# Patient Record
Sex: Male | Born: 1965 | Race: Black or African American | Hispanic: No | Marital: Single | Smoking: Never smoker
Health system: Southern US, Community
[De-identification: ages and names within clinical notes are randomized; demographics above are authoritative.]

## PROBLEM LIST (undated history)

## (undated) DIAGNOSIS — I10 Essential (primary) hypertension: Secondary | ICD-10-CM

## (undated) DIAGNOSIS — E7800 Pure hypercholesterolemia, unspecified: Secondary | ICD-10-CM

## (undated) DIAGNOSIS — D649 Anemia, unspecified: Secondary | ICD-10-CM

## (undated) DIAGNOSIS — J189 Pneumonia, unspecified organism: Secondary | ICD-10-CM

## (undated) DIAGNOSIS — G4730 Sleep apnea, unspecified: Secondary | ICD-10-CM

## (undated) DIAGNOSIS — M1990 Unspecified osteoarthritis, unspecified site: Secondary | ICD-10-CM

---

## 2001-08-07 ENCOUNTER — Ambulatory Visit (HOSPITAL_COMMUNITY): Admission: RE | Admit: 2001-08-07 | Discharge: 2001-08-07 | Payer: Self-pay | Admitting: Internal Medicine

## 2001-08-07 ENCOUNTER — Encounter: Payer: Self-pay | Admitting: Internal Medicine

## 2003-09-15 HISTORY — PX: OTHER SURGICAL HISTORY: SHX169

## 2017-09-14 HISTORY — PX: HIP ARTHROPLASTY: SHX981

## 2019-09-15 HISTORY — PX: HERNIA REPAIR: SHX51

## 2020-08-23 ENCOUNTER — Other Ambulatory Visit: Payer: Self-pay | Admitting: Neurological Surgery

## 2020-09-19 NOTE — Progress Notes (Signed)
[id number] [place], [place] - [address] [place] [address] Phone: [phone] Fax: [phone]      Your procedure is scheduled on Tuesday, January 11th.  Report to [facility] Main Entrance "A" at 5:30 A.M., and check in at the Admitting office.  Call this number if you have problems the morning of surgery:  [phone]  Call [phone] if you have any questions prior to your surgery date Monday-Friday 8am-4pm    Remember:  Do not eat or drink after midnight the night before your surgery    Take these medicines the morning of surgery with A SIP OF WATER   Amlodipine (Norvasc)  Eye drops - if needed  Omeprazole (Prilosec)  Nasal spray - if needed  Simvastatin (Zocor)    As of today, STOP taking any Aspirin (unless otherwise instructed by your surgeon) Aleve, Naproxen, Ibuprofen, Motrin, Advil, Goody's, BC's, all herbal medications, fish oil, and all vitamins.                      Do not wear jewelry            Do not wear lotions, powders, colognes, or deodorant.            Men may shave face and neck.            Do not bring valuables to the hospital.            [facility] is not responsible for any belongings or valuables.  Do NOT Smoke (Tobacco/Vaping) or drink Alcohol 24 hours prior to your procedure If you use a CPAP at night, you may bring all equipment for your overnight stay.   Contacts, glasses, dentures or bridgework may not be worn into surgery.      For patients admitted to the hospital, discharge time will be determined by your treatment team.   Patients discharged the day of surgery will not be allowed to drive home, and someone needs to stay with them for 24 hours.    Special instructions:   [facility]- Preparing For Surgery  Before surgery, you can play an important role. Because skin is not sterile, your skin needs to be as free of germs as possible. You can reduce the number of germs on your skin by  washing with CHG (chlorahexidine gluconate) Soap before surgery.  CHG is an antiseptic cleaner which kills germs and bonds with the skin to continue killing germs even after washing.    Oral Hygiene is also important to reduce your risk of infection.  Remember - BRUSH YOUR TEETH THE MORNING OF SURGERY WITH YOUR REGULAR TOOTHPASTE  Please do not use if you have an allergy to CHG or antibacterial soaps. If your skin becomes reddened/irritated stop using the CHG.  Do not shave (including legs and underarms) for at least 48 hours prior to first CHG shower. It is OK to shave your face.  Please follow these instructions carefully.   1. Shower the NIGHT BEFORE SURGERY and the MORNING OF SURGERY with CHG Soap.   2. If you chose to wash your hair, wash your hair first as usual with your normal shampoo.  3. After you shampoo, rinse your hair and body thoroughly to remove the shampoo.  4. Use CHG as you would any other liquid soap. You can apply CHG directly to the skin and wash gently with a scrungie or a clean washcloth.   5. Apply the  CHG Soap to your body ONLY FROM THE NECK DOWN.  Do not use on open wounds or open sores. Avoid contact with your eyes, ears, mouth and genitals (private parts). Wash Face and genitals (private parts)  with your normal soap.   6. Wash thoroughly, paying special attention to the area where your surgery will be performed.  7. Thoroughly rinse your body with warm water from the neck down.  8. DO NOT shower/wash with your normal soap after using and rinsing off the CHG Soap.  9. Pat yourself dry with a CLEAN TOWEL.  10. Wear CLEAN PAJAMAS to bed the night before surgery  11. Place CLEAN SHEETS on your bed the night of your first shower and DO NOT SLEEP WITH PETS.   Day of Surgery: Wear Clean/Comfortable clothing the morning of surgery Do not apply any deodorants/lotions.   Remember to brush your teeth WITH YOUR REGULAR TOOTHPASTE.   Please read over the  following fact sheets that you were given.

## 2020-09-20 ENCOUNTER — Other Ambulatory Visit (HOSPITAL_COMMUNITY)
Admission: RE | Admit: 2020-09-20 | Discharge: 2020-09-20 | Disposition: A | Discharge status: Home / Self Care | Payer: BC Managed Care – PPO | Source: Ambulatory Visit | Attending: Neurological Surgery | Admitting: Neurological Surgery

## 2020-09-20 ENCOUNTER — Encounter (HOSPITAL_COMMUNITY)
Admission: RE | Admit: 2020-09-20 | Discharge: 2020-09-20 | Disposition: A | Discharge status: Home / Self Care | Payer: BC Managed Care – PPO | Source: Ambulatory Visit | Attending: Neurological Surgery | Admitting: Neurological Surgery

## 2020-09-20 ENCOUNTER — Encounter (HOSPITAL_COMMUNITY): Payer: Self-pay

## 2020-09-20 ENCOUNTER — Other Ambulatory Visit: Payer: Self-pay

## 2020-09-20 DIAGNOSIS — Z20822 Contact with and (suspected) exposure to covid-19: Secondary | ICD-10-CM | POA: Diagnosis not present

## 2020-09-20 DIAGNOSIS — I10 Essential (primary) hypertension: Secondary | ICD-10-CM | POA: Insufficient documentation

## 2020-09-20 DIAGNOSIS — Z01812 Encounter for preprocedural laboratory examination: Secondary | ICD-10-CM | POA: Diagnosis present

## 2020-09-20 DIAGNOSIS — Z01818 Encounter for other preprocedural examination: Secondary | ICD-10-CM | POA: Diagnosis not present

## 2020-09-20 HISTORY — DX: Essential (primary) hypertension: I10

## 2020-09-20 HISTORY — DX: Pure hypercholesterolemia, unspecified: E78.00

## 2020-09-20 HISTORY — DX: Sleep apnea, unspecified: G47.30

## 2020-09-20 HISTORY — DX: Anemia, unspecified: D64.9

## 2020-09-20 HISTORY — DX: Pneumonia, unspecified organism: J18.9

## 2020-09-20 HISTORY — DX: Unspecified osteoarthritis, unspecified site: M19.90

## 2020-09-20 LAB — BASIC METABOLIC PANEL
Anion gap: 9 (ref 5–15)
BUN: 14 mg/dL (ref 6–20)
CO2: 25 mmol/L (ref 22–32)
Calcium: 9.3 mg/dL (ref 8.9–10.3)
Chloride: 105 mmol/L (ref 98–111)
Creatinine, Ser: 1.55 mg/dL — ABNORMAL HIGH (ref 0.61–1.24)
GFR, Estimated: 53 mL/min — ABNORMAL LOW (ref >60)
Glucose, Bld: 109 mg/dL — ABNORMAL HIGH (ref 70–99)
Potassium: 3.9 mmol/L (ref 3.5–5.1)
Sodium: 139 mmol/L (ref 135–145)

## 2020-09-20 LAB — TYPE AND SCREEN
ABO/RH(D): O POS
Antibody Screen: NEGATIVE

## 2020-09-20 LAB — CBC
HCT: 41.5 % (ref 39.0–52.0)
Hemoglobin: 13.7 g/dL (ref 13.0–17.0)
MCH: 31.3 pg (ref 26.0–34.0)
MCHC: 33 g/dL (ref 30.0–36.0)
MCV: 94.7 fL (ref 80.0–100.0)
Platelets: 240 10*3/uL (ref 150–400)
RBC: 4.38 MIL/uL (ref 4.22–5.81)
RDW: 11.5 % (ref 11.5–15.5)
WBC: 5.5 10*3/uL (ref 4.0–10.5)
nRBC: 0 % (ref 0.0–0.2)

## 2020-09-20 LAB — SURGICAL PCR SCREEN
MRSA, PCR: NEGATIVE
Staphylococcus aureus: POSITIVE — AB

## 2020-09-20 LAB — SARS CORONAVIRUS 2 (TAT 6-24 HRS): SARS Coronavirus 2: NEGATIVE

## 2020-09-20 NOTE — Progress Notes (Addendum)
PCP - Vivia Ewing [place], [place]) Cardiologist - denies  Chest x-ray - n/a EKG - 09-20-20   SA - yes, does not wear CPAP   COVID TEST- 09-20-20   Anesthesia review: yes, EKG  Patient denies shortness of breath, fever, cough and chest pain at PAT appointment   All instructions explained to the patient, with a verbal understanding of the material. Patient agrees to go over the instructions while at home for a better understanding. Patient also instructed to self quarantine after being tested for COVID-19. The opportunity to ask questions was provided.

## 2020-09-24 ENCOUNTER — Ambulatory Visit (HOSPITAL_COMMUNITY): Payer: BC Managed Care – PPO | Admitting: Vascular Surgery

## 2020-09-24 ENCOUNTER — Encounter (HOSPITAL_COMMUNITY): Payer: Self-pay | Admitting: Neurological Surgery

## 2020-09-24 ENCOUNTER — Inpatient Hospital Stay (HOSPITAL_COMMUNITY)
Admission: AD | Admit: 2020-09-24 | Discharge: 2020-09-26 | DRG: 455 | Disposition: A | Discharge status: Home Health | Payer: BC Managed Care – PPO | Attending: Neurological Surgery | Admitting: Neurological Surgery

## 2020-09-24 ENCOUNTER — Encounter (HOSPITAL_COMMUNITY)
Admission: AD | Disposition: A | Discharge status: Home Health | Payer: Self-pay | Source: Home / Self Care | Attending: Neurological Surgery

## 2020-09-24 ENCOUNTER — Other Ambulatory Visit: Payer: Self-pay

## 2020-09-24 ENCOUNTER — Ambulatory Visit (HOSPITAL_COMMUNITY): Payer: BC Managed Care – PPO | Admitting: Certified Registered"

## 2020-09-24 ENCOUNTER — Ambulatory Visit (HOSPITAL_COMMUNITY): Payer: BC Managed Care – PPO

## 2020-09-24 DIAGNOSIS — M4807 Spinal stenosis, lumbosacral region: Secondary | ICD-10-CM | POA: Diagnosis present

## 2020-09-24 DIAGNOSIS — I10 Essential (primary) hypertension: Secondary | ICD-10-CM | POA: Diagnosis present

## 2020-09-24 DIAGNOSIS — Z8701 Personal history of pneumonia (recurrent): Secondary | ICD-10-CM | POA: Diagnosis not present

## 2020-09-24 DIAGNOSIS — M48061 Spinal stenosis, lumbar region without neurogenic claudication: Secondary | ICD-10-CM | POA: Diagnosis present

## 2020-09-24 DIAGNOSIS — M4317 Spondylolisthesis, lumbosacral region: Secondary | ICD-10-CM | POA: Diagnosis present

## 2020-09-24 DIAGNOSIS — M5416 Radiculopathy, lumbar region: Secondary | ICD-10-CM | POA: Diagnosis present

## 2020-09-24 DIAGNOSIS — M1990 Unspecified osteoarthritis, unspecified site: Secondary | ICD-10-CM | POA: Diagnosis present

## 2020-09-24 DIAGNOSIS — G4730 Sleep apnea, unspecified: Secondary | ICD-10-CM | POA: Diagnosis present

## 2020-09-24 DIAGNOSIS — Z419 Encounter for procedure for purposes other than remedying health state, unspecified: Secondary | ICD-10-CM

## 2020-09-24 HISTORY — PX: TRANSFORAMINAL LUMBAR INTERBODY FUSION (TLIF) WITH PEDICLE SCREW FIXATION 3 LEVEL: SHX6143

## 2020-09-24 LAB — ABO/RH: ABO/RH(D): O POS

## 2020-09-24 SURGERY — TRANSFORAMINAL LUMBAR INTERBODY FUSION (TLIF) WITH PEDICLE SCREW FIXATION 3 LEVEL
Anesthesia: General | Site: Back

## 2020-09-24 MED ORDER — AMLODIPINE BESYLATE 5 MG PO TABS
5.0000 mg | ORAL_TABLET | Freq: Every day | ORAL | Status: DC
  Administered 2020-09-24 – 2020-09-26 (×3): 5 mg via ORAL
  Filled 2020-09-24 (×3): qty 1

## 2020-09-24 MED ORDER — CHLORHEXIDINE GLUCONATE CLOTH 2 % EX PADS: 6.0000 | MEDICATED_PAD | Freq: Once | CUTANEOUS | Status: DC

## 2020-09-24 MED ORDER — ACETAMINOPHEN 325 MG PO TABS
650.0000 mg | ORAL_TABLET | ORAL | Status: DC | PRN
  Administered 2020-09-25: 650 mg via ORAL
  Filled 2020-09-24: qty 2

## 2020-09-24 MED ORDER — DOCUSATE SODIUM 100 MG PO CAPS
100.0000 mg | ORAL_CAPSULE | Freq: Two times a day (BID) | ORAL | Status: DC
  Administered 2020-09-24 – 2020-09-26 (×4): 100 mg via ORAL
  Filled 2020-09-24 (×4): qty 1

## 2020-09-24 MED ORDER — CHLORHEXIDINE GLUCONATE 0.12 % MT SOLN: 15.0000 mL | Freq: Once | OROMUCOSAL | Status: AC

## 2020-09-24 MED ORDER — TAMSULOSIN HCL 0.4 MG PO CAPS
0.4000 mg | ORAL_CAPSULE | Freq: Every day | ORAL | Status: DC
  Administered 2020-09-24 – 2020-09-25 (×2): 0.4 mg via ORAL
  Filled 2020-09-24 (×2): qty 1

## 2020-09-24 MED ORDER — PANTOPRAZOLE SODIUM 40 MG PO TBEC
40.0000 mg | DELAYED_RELEASE_TABLET | Freq: Every day | ORAL | Status: DC
  Administered 2020-09-25 – 2020-09-26 (×2): 40 mg via ORAL
  Filled 2020-09-24 (×2): qty 1

## 2020-09-24 MED ORDER — CYCLOBENZAPRINE HCL 10 MG PO TABS
10.0000 mg | ORAL_TABLET | Freq: Three times a day (TID) | ORAL | Status: DC | PRN
  Administered 2020-09-24 – 2020-09-26 (×5): 10 mg via ORAL
  Filled 2020-09-24 (×5): qty 1

## 2020-09-24 MED ORDER — ROCURONIUM BROMIDE 10 MG/ML (PF) SYRINGE
PREFILLED_SYRINGE | INTRAVENOUS | Status: AC
  Filled 2020-09-24: qty 10

## 2020-09-24 MED ORDER — PROPOFOL 10 MG/ML IV BOLUS
INTRAVENOUS | Status: DC | PRN
Start: 2020-09-24 — End: 2020-09-24
  Administered 2020-09-24: 170 mg via INTRAVENOUS

## 2020-09-24 MED ORDER — SODIUM CHLORIDE (PF) 0.9 % IJ SOLN
INTRAMUSCULAR | Status: AC
  Filled 2020-09-24: qty 10

## 2020-09-24 MED ORDER — 0.9 % SODIUM CHLORIDE (POUR BTL) OPTIME
TOPICAL | Status: DC | PRN
  Administered 2020-09-24: 1000 mL

## 2020-09-24 MED ORDER — OXYCODONE HCL 5 MG/5ML PO SOLN: 5.0000 mg | Freq: Once | ORAL | Status: DC | PRN

## 2020-09-24 MED ORDER — ONDANSETRON HCL 4 MG/2ML IJ SOLN
INTRAMUSCULAR | Status: AC
  Filled 2020-09-24: qty 2

## 2020-09-24 MED ORDER — LIDOCAINE-EPINEPHRINE 1 %-1:100000 IJ SOLN
INTRAMUSCULAR | Status: AC
  Filled 2020-09-24: qty 1

## 2020-09-24 MED ORDER — HYDROMORPHONE HCL 1 MG/ML IJ SOLN
1.0000 mg | INTRAMUSCULAR | Status: DC | PRN
  Administered 2020-09-24 – 2020-09-25 (×3): 1 mg via INTRAVENOUS
  Filled 2020-09-24 (×3): qty 1

## 2020-09-24 MED ORDER — SUFENTANIL CITRATE 50 MCG/ML IV SOLN
INTRAVENOUS | Status: DC | PRN
  Administered 2020-09-24 (×2): 10 ug via INTRAVENOUS
  Administered 2020-09-24: 30 ug via INTRAVENOUS
  Administered 2020-09-24 (×3): 10 ug via INTRAVENOUS
  Administered 2020-09-24: 5 ug via INTRAVENOUS
  Administered 2020-09-24: 10 ug via INTRAVENOUS
  Administered 2020-09-24: 5 ug via INTRAVENOUS

## 2020-09-24 MED ORDER — POLYETHYLENE GLYCOL 3350 17 G PO PACK
17.0000 g | PACK | Freq: Every day | ORAL | Status: DC | PRN
  Administered 2020-09-26: 17 g via ORAL
  Filled 2020-09-24: qty 1

## 2020-09-24 MED ORDER — SODIUM CHLORIDE 0.9% FLUSH
3.0000 mL | Freq: Two times a day (BID) | INTRAVENOUS | Status: DC
  Administered 2020-09-24: 3 mL via INTRAVENOUS

## 2020-09-24 MED ORDER — SIMVASTATIN 20 MG PO TABS
40.0000 mg | ORAL_TABLET | Freq: Every day | ORAL | Status: DC
  Administered 2020-09-24 – 2020-09-25 (×2): 40 mg via ORAL
  Filled 2020-09-24 (×2): qty 2

## 2020-09-24 MED ORDER — OXYCODONE HCL 5 MG PO TABS: 5.0000 mg | ORAL_TABLET | ORAL | Status: DC | PRN

## 2020-09-24 MED ORDER — ORAL CARE MOUTH RINSE: 15.0000 mL | Freq: Once | OROMUCOSAL | Status: AC

## 2020-09-24 MED ORDER — CEFAZOLIN SODIUM-DEXTROSE 2-4 GM/100ML-% IV SOLN
2.0000 g | Freq: Three times a day (TID) | INTRAVENOUS | Status: AC
  Administered 2020-09-24 – 2020-09-25 (×2): 2 g via INTRAVENOUS
  Filled 2020-09-24 (×3): qty 100

## 2020-09-24 MED ORDER — PHENYLEPHRINE HCL (PRESSORS) 10 MG/ML IV SOLN
INTRAVENOUS | Status: AC
  Filled 2020-09-24: qty 1

## 2020-09-24 MED ORDER — ONDANSETRON HCL 4 MG/2ML IJ SOLN
INTRAMUSCULAR | Status: DC | PRN
  Administered 2020-09-24: 4 mg via INTRAVENOUS

## 2020-09-24 MED ORDER — CEFAZOLIN SODIUM 1 G IJ SOLR
INTRAMUSCULAR | Status: AC
  Filled 2020-09-24: qty 10

## 2020-09-24 MED ORDER — FENTANYL CITRATE (PF) 100 MCG/2ML IJ SOLN
INTRAMUSCULAR | Status: AC
  Filled 2020-09-24: qty 2

## 2020-09-24 MED ORDER — ACETAMINOPHEN 650 MG RE SUPP: 650.0000 mg | RECTAL | Status: DC | PRN

## 2020-09-24 MED ORDER — OXYCODONE HCL 5 MG PO TABS
10.0000 mg | ORAL_TABLET | ORAL | Status: DC | PRN
  Administered 2020-09-24 – 2020-09-26 (×9): 10 mg via ORAL
  Filled 2020-09-24 (×9): qty 2

## 2020-09-24 MED ORDER — ROCURONIUM BROMIDE 10 MG/ML (PF) SYRINGE
PREFILLED_SYRINGE | INTRAVENOUS | Status: DC | PRN
  Administered 2020-09-24: 60 mg via INTRAVENOUS

## 2020-09-24 MED ORDER — MIDAZOLAM HCL 2 MG/2ML IJ SOLN
INTRAMUSCULAR | Status: AC
  Filled 2020-09-24: qty 2

## 2020-09-24 MED ORDER — MIDAZOLAM HCL 5 MG/5ML IJ SOLN
INTRAMUSCULAR | Status: DC | PRN
  Administered 2020-09-24: 2 mg via INTRAVENOUS

## 2020-09-24 MED ORDER — SODIUM CHLORIDE 0.9% FLUSH: 3.0000 mL | INTRAVENOUS | Status: DC | PRN

## 2020-09-24 MED ORDER — ONDANSETRON HCL 4 MG PO TABS: 4.0000 mg | ORAL_TABLET | Freq: Four times a day (QID) | ORAL | Status: DC | PRN

## 2020-09-24 MED ORDER — EPHEDRINE SULFATE-NACL 50-0.9 MG/10ML-% IV SOSY
PREFILLED_SYRINGE | INTRAVENOUS | Status: DC | PRN
  Administered 2020-09-24: 10 mg via INTRAVENOUS

## 2020-09-24 MED ORDER — THROMBIN 5000 UNITS EX SOLR: OROMUCOSAL | Status: DC | PRN

## 2020-09-24 MED ORDER — HYDROCHLOROTHIAZIDE 12.5 MG PO CAPS
12.5000 mg | ORAL_CAPSULE | Freq: Every day | ORAL | Status: DC
  Administered 2020-09-24 – 2020-09-26 (×3): 12.5 mg via ORAL
  Filled 2020-09-24 (×3): qty 1

## 2020-09-24 MED ORDER — SUFENTANIL CITRATE 50 MCG/ML IV SOLN
INTRAVENOUS | Status: AC
  Filled 2020-09-24: qty 1

## 2020-09-24 MED ORDER — LIDOCAINE 2% (20 MG/ML) 5 ML SYRINGE
INTRAMUSCULAR | Status: DC | PRN
  Administered 2020-09-24: 60 mg via INTRAVENOUS

## 2020-09-24 MED ORDER — BUPIVACAINE HCL (PF) 0.5 % IJ SOLN
INTRAMUSCULAR | Status: DC | PRN
  Administered 2020-09-24: 5 mL

## 2020-09-24 MED ORDER — VALSARTAN-HYDROCHLOROTHIAZIDE 160-12.5 MG PO TABS: 1.0000 | ORAL_TABLET | Freq: Every day | ORAL | Status: DC

## 2020-09-24 MED ORDER — PHENYLEPHRINE HCL-NACL 10-0.9 MG/250ML-% IV SOLN
INTRAVENOUS | Status: DC | PRN
  Administered 2020-09-24: 50 ug/min via INTRAVENOUS

## 2020-09-24 MED ORDER — PROPOFOL 10 MG/ML IV BOLUS
INTRAVENOUS | Status: AC
  Filled 2020-09-24: qty 20

## 2020-09-24 MED ORDER — IRBESARTAN 150 MG PO TABS
150.0000 mg | ORAL_TABLET | Freq: Every day | ORAL | Status: DC
  Administered 2020-09-24 – 2020-09-26 (×3): 150 mg via ORAL
  Filled 2020-09-24 (×3): qty 1

## 2020-09-24 MED ORDER — OXYCODONE HCL 5 MG PO TABS: 5.0000 mg | ORAL_TABLET | Freq: Once | ORAL | Status: DC | PRN

## 2020-09-24 MED ORDER — PHENYLEPHRINE 40 MCG/ML (10ML) SYRINGE FOR IV PUSH (FOR BLOOD PRESSURE SUPPORT)
PREFILLED_SYRINGE | INTRAVENOUS | Status: DC | PRN
  Administered 2020-09-24: 80 ug via INTRAVENOUS
  Administered 2020-09-24 (×2): 120 ug via INTRAVENOUS
  Administered 2020-09-24: 80 ug via INTRAVENOUS

## 2020-09-24 MED ORDER — DEXAMETHASONE SODIUM PHOSPHATE 10 MG/ML IJ SOLN
INTRAMUSCULAR | Status: DC | PRN
  Administered 2020-09-24: 10 mg via INTRAVENOUS

## 2020-09-24 MED ORDER — ALBUMIN HUMAN 5 % IV SOLN: INTRAVENOUS | Status: DC | PRN

## 2020-09-24 MED ORDER — LATANOPROST 0.005 % OP SOLN
1.0000 [drp] | Freq: Every day | OPHTHALMIC | Status: DC
  Administered 2020-09-24 – 2020-09-25 (×2): 1 [drp] via OPHTHALMIC
  Filled 2020-09-24: qty 2.5

## 2020-09-24 MED ORDER — THROMBIN 5000 UNITS EX SOLR
CUTANEOUS | Status: AC
  Filled 2020-09-24: qty 5000

## 2020-09-24 MED ORDER — CHLORHEXIDINE GLUCONATE 0.12 % MT SOLN
OROMUCOSAL | Status: AC
  Administered 2020-09-24: 15 mL via OROMUCOSAL
  Filled 2020-09-24: qty 15

## 2020-09-24 MED ORDER — MENTHOL 3 MG MT LOZG: 1.0000 | LOZENGE | OROMUCOSAL | Status: DC | PRN

## 2020-09-24 MED ORDER — PHENOL 1.4 % MT LIQD: 1.0000 | OROMUCOSAL | Status: DC | PRN

## 2020-09-24 MED ORDER — BUPIVACAINE HCL (PF) 0.5 % IJ SOLN
INTRAMUSCULAR | Status: AC
  Filled 2020-09-24: qty 30

## 2020-09-24 MED ORDER — DEXAMETHASONE SODIUM PHOSPHATE 10 MG/ML IJ SOLN
INTRAMUSCULAR | Status: AC
  Filled 2020-09-24: qty 1

## 2020-09-24 MED ORDER — ONDANSETRON HCL 4 MG/2ML IJ SOLN: 4.0000 mg | Freq: Four times a day (QID) | INTRAMUSCULAR | Status: DC | PRN

## 2020-09-24 MED ORDER — OXYMETAZOLINE HCL 0.05 % NA SOLN
1.0000 | Freq: Every evening | NASAL | Status: DC | PRN
  Filled 2020-09-24: qty 30

## 2020-09-24 MED ORDER — PHENYLEPHRINE 40 MCG/ML (10ML) SYRINGE FOR IV PUSH (FOR BLOOD PRESSURE SUPPORT)
PREFILLED_SYRINGE | INTRAVENOUS | Status: AC
  Filled 2020-09-24: qty 10

## 2020-09-24 MED ORDER — SODIUM CHLORIDE 0.9 % IV SOLN: 250.0000 mL | INTRAVENOUS | Status: DC

## 2020-09-24 MED ORDER — LIDOCAINE-EPINEPHRINE 1 %-1:100000 IJ SOLN
INTRAMUSCULAR | Status: DC | PRN
  Administered 2020-09-24: 5 mL

## 2020-09-24 MED ORDER — EPHEDRINE 5 MG/ML INJ
INTRAVENOUS | Status: AC
  Filled 2020-09-24: qty 10

## 2020-09-24 MED ORDER — FENTANYL CITRATE (PF) 100 MCG/2ML IJ SOLN
25.0000 ug | INTRAMUSCULAR | Status: DC | PRN
  Administered 2020-09-24 (×4): 25 ug via INTRAVENOUS

## 2020-09-24 MED ORDER — TRAZODONE HCL 100 MG PO TABS
100.0000 mg | ORAL_TABLET | Freq: Every evening | ORAL | Status: DC | PRN
  Filled 2020-09-24: qty 1

## 2020-09-24 MED ORDER — LACTATED RINGERS IV SOLN: INTRAVENOUS | Status: DC

## 2020-09-24 MED ORDER — CEFAZOLIN SODIUM-DEXTROSE 2-4 GM/100ML-% IV SOLN
INTRAVENOUS | Status: AC
  Filled 2020-09-24: qty 100

## 2020-09-24 MED ORDER — LIDOCAINE 2% (20 MG/ML) 5 ML SYRINGE
INTRAMUSCULAR | Status: AC
  Filled 2020-09-24: qty 5

## 2020-09-24 MED ORDER — CEFAZOLIN SODIUM-DEXTROSE 2-4 GM/100ML-% IV SOLN
2.0000 g | INTRAVENOUS | Status: AC
  Administered 2020-09-24 (×2): 2 g via INTRAVENOUS

## 2020-09-24 SURGICAL SUPPLY — 81 items
ADH SKN CLS APL DERMABOND .7 (GAUZE/BANDAGES/DRESSINGS) ×2
APL SKNCLS STERI-STRIP NONHPOA (GAUZE/BANDAGES/DRESSINGS)
BAND INSRT 18 STRL LF DISP RB (MISCELLANEOUS)
BAND RUBBER #18 3X1/16 STRL (MISCELLANEOUS) ×2 IMPLANT
BASKET BONE COLLECTION (BASKET) ×1 IMPLANT
BENZOIN TINCTURE PRP APPL 2/3 (GAUZE/BANDAGES/DRESSINGS) IMPLANT
BLADE CLIPPER SURG (BLADE) IMPLANT
BLADE SURG 11 STRL SS (BLADE) ×3 IMPLANT
BUR MATCHSTICK NEURO 3.0 LAGG (BURR) ×3 IMPLANT
BUR PRECISION FLUTE 5.0 (BURR) ×7 IMPLANT
CANISTER SUCT 3000ML PPV (MISCELLANEOUS) ×3 IMPLANT
CNTNR URN SCR LID CUP LEK RST (MISCELLANEOUS) ×2 IMPLANT
CONT SPEC 4OZ STRL OR WHT (MISCELLANEOUS) ×3
COVER BACK TABLE 60X90IN (DRAPES) ×3 IMPLANT
COVER WAND RF STERILE (DRAPES) ×3 IMPLANT
DECANTER SPIKE VIAL GLASS SM (MISCELLANEOUS) ×3 IMPLANT
DERMABOND ADVANCED (GAUZE/BANDAGES/DRESSINGS) ×1
DERMABOND ADVANCED .7 DNX12 (GAUZE/BANDAGES/DRESSINGS) ×2 IMPLANT
DEVICE INTERBODY ELEVATE 23X7 (Cage) ×2 IMPLANT
DEVICE INTERBODY ELEVATE 23X8 (Cage) ×6 IMPLANT
DRAPE C-ARM 42X72 X-RAY (DRAPES) IMPLANT
DRAPE C-ARMOR (DRAPES) ×2 IMPLANT
DRAPE LAPAROTOMY 100X72X124 (DRAPES) ×3 IMPLANT
DRAPE MICROSCOPE LEICA (MISCELLANEOUS) ×1 IMPLANT
DRAPE SURG 17X23 STRL (DRAPES) ×3 IMPLANT
DURAPREP 26ML APPLICATOR (WOUND CARE) ×3 IMPLANT
ELECT REM PT RETURN 9FT ADLT (ELECTROSURGICAL) ×3
ELECTRODE REM PT RTRN 9FT ADLT (ELECTROSURGICAL) ×2 IMPLANT
EVACUATOR 1/8 PVC DRAIN (DRAIN) ×2 IMPLANT
GAUZE 4X4 16PLY RFD (DISPOSABLE) IMPLANT
GAUZE SPONGE 4X4 12PLY STRL (GAUZE/BANDAGES/DRESSINGS) IMPLANT
GLOVE BIO SURGEON STRL SZ8 (GLOVE) ×4 IMPLANT
GLOVE BIOGEL PI IND STRL 7.5 (GLOVE) ×4 IMPLANT
GLOVE BIOGEL PI INDICATOR 7.5 (GLOVE) ×2
GLOVE ECLIPSE 6.5 STRL STRAW (GLOVE) ×6 IMPLANT
GLOVE ECLIPSE 7.5 STRL STRAW (GLOVE) ×10 IMPLANT
GLOVE EXAM NITRILE LRG STRL (GLOVE) IMPLANT
GLOVE EXAM NITRILE XL STR (GLOVE) IMPLANT
GLOVE EXAM NITRILE XS STR PU (GLOVE) IMPLANT
GLOVE SRG 8 PF TXTR STRL LF DI (GLOVE) ×2 IMPLANT
GLOVE SURG UNDER POLY LF SZ6.5 (GLOVE) ×4 IMPLANT
GLOVE SURG UNDER POLY LF SZ8 (GLOVE) ×6
GOWN STRL REUS W/ TWL LRG LVL3 (GOWN DISPOSABLE) ×8 IMPLANT
GOWN STRL REUS W/ TWL XL LVL3 (GOWN DISPOSABLE) ×1 IMPLANT
GOWN STRL REUS W/TWL 2XL LVL3 (GOWN DISPOSABLE) ×2 IMPLANT
GOWN STRL REUS W/TWL LRG LVL3 (GOWN DISPOSABLE) ×12
GOWN STRL REUS W/TWL XL LVL3 (GOWN DISPOSABLE) ×3
HEMOSTAT POWDER KIT SURGIFOAM (HEMOSTASIS) ×3 IMPLANT
KIT BASIN OR (CUSTOM PROCEDURE TRAY) ×3 IMPLANT
KIT POSITION SURG JACKSON T1 (MISCELLANEOUS) ×3 IMPLANT
KIT TURNOVER KIT B (KITS) ×3 IMPLANT
MILL MEDIUM DISP (BLADE) IMPLANT
NDL HYPO 18GX1.5 BLUNT FILL (NEEDLE) IMPLANT
NDL SPNL 18GX3.5 QUINCKE PK (NEEDLE) IMPLANT
NEEDLE HYPO 18GX1.5 BLUNT FILL (NEEDLE) IMPLANT
NEEDLE HYPO 22GX1.5 SAFETY (NEEDLE) ×3 IMPLANT
NEEDLE SPNL 18GX3.5 QUINCKE PK (NEEDLE) ×3 IMPLANT
NS IRRIG 1000ML POUR BTL (IV SOLUTION) ×3 IMPLANT
PACK LAMINECTOMY NEURO (CUSTOM PROCEDURE TRAY) ×3 IMPLANT
PAD ARMBOARD 7.5X6 YLW CONV (MISCELLANEOUS) ×13 IMPLANT
ROD CVD SOLERA 5.5X90 (Rod) ×4 IMPLANT
SCREW 6.5X40 (Screw) ×16 IMPLANT
SCREW SET SOLERA (Screw) ×24 IMPLANT
SCREW SET SOLERA TI5.5 (Screw) ×8 IMPLANT
SPACER SPNL STD 23X8XSTRL (Cage) ×1 IMPLANT
SPACER SPNL XLORDOTIC 23X8X (Cage) ×1 IMPLANT
SPCR SPNL STD 23X8XSTRL (Cage) ×2 IMPLANT
SPCR SPNL XLORDOTIC 23X8X (Cage) ×2 IMPLANT
SPONGE LAP 4X18 RFD (DISPOSABLE) IMPLANT
SPONGE SURGIFOAM ABS GEL 100 (HEMOSTASIS) IMPLANT
STRIP CLOSURE SKIN 1/2X4 (GAUZE/BANDAGES/DRESSINGS) IMPLANT
SUT MNCRL AB 3-0 PS2 18 (SUTURE) ×3 IMPLANT
SUT VIC AB 0 CT1 18XCR BRD8 (SUTURE) ×2 IMPLANT
SUT VIC AB 0 CT1 8-18 (SUTURE) ×3
SUT VIC AB 2-0 CP2 18 (SUTURE) ×3 IMPLANT
SUT VIC AB 3-0 SH 8-18 (SUTURE) ×2 IMPLANT
SYR 3ML LL SCALE MARK (SYRINGE) IMPLANT
TOWEL GREEN STERILE (TOWEL DISPOSABLE) ×3 IMPLANT
TOWEL GREEN STERILE FF (TOWEL DISPOSABLE) ×3 IMPLANT
TRAY FOLEY MTR SLVR 16FR STAT (SET/KITS/TRAYS/PACK) ×3 IMPLANT
WATER STERILE IRR 1000ML POUR (IV SOLUTION) ×3 IMPLANT

## 2020-09-24 NOTE — Brief Op Note (Signed)
09/24/2020  3:35 PM  PATIENT:  Dale Grimes  55 y.o. male  PRE-OPERATIVE DIAGNOSIS:  Lumbar radiculopathy  POST-OPERATIVE DIAGNOSIS:  Lumbar radiculopathy  PROCEDURE:  Procedure(s): Lumbar three -four, lumbar  four-five Lumbar five Sacral one Transforaminal lumbar interbody fusion (N/A)  SURGEON:  Surgeon(s) and Role:    * Jadene Pierini, MD - Primary    * Dawley, Alan Mulder, DO - Assisting  PHYSICIAN ASSISTANT:   ANESTHESIA:   general  EBL:  400 mL   BLOOD ADMINISTERED:none  DRAINS: Hemovac in lumbar subfascial space   LOCAL MEDICATIONS USED:  LIDOCAINE   SPECIMEN:  No Specimen  DISPOSITION OF SPECIMEN:  N/A  COUNTS:  YES  TOURNIQUET:  * No tourniquets in log *  DICTATION: .Note written in EPIC  PLAN OF CARE: Admit to inpatient   PATIENT DISPOSITION:  PACU - hemodynamically stable.   Delay start of Pharmacological VTE agent (>24hrs) due to surgical blood loss or risk of bleeding: yes

## 2020-09-24 NOTE — H&P (Signed)
Surgical H&P Update  HPI: 55 y.o. man with a history of right greater than left lower extremity radiculopathy with weakness. Initial symptoms consisted of severe pain in R>L L4, L5, and S1 dermatomes of the right greater than left foot. 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 No changes in health since he was last seen. Still having symptoms and wishes to proceed with surgery.  PMHx:  Past Medical History:  Diagnosis Date  . Anemia   . Arthritis   . High cholesterol   . Hypertension   . Pneumonia   . Sleep apnea    Does not wear CPAP   FamHx: History reviewed. No pertinent family history. SocHx:  reports that he has never smoked. He has never used smokeless tobacco. He reports current alcohol use of about 2.0 standard drinks of alcohol per week. He reports that he does not use drugs.  Physical Exam: AOx3, PERRL, FS, TM  Strength 5/5 x4 except 4/5 in right DF/TA/EHL, SILTx4 except L5 and S1 dermatomes on right, L5 on left  Assesment/Plan: 55 y.o. 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 Risks, benefits, and alternatives discussed and the patient would like to continue with surgery.  -OR today -3C post-op  Jadene Pierini, MD 09/24/20 7:10 AM

## 2020-09-24 NOTE — Anesthesia Procedure Notes (Addendum)
Procedure Name: Intubation Date/Time: 09/24/2020 7:37 AM Performed by: Moshe Salisbury, CRNA Pre-anesthesia Checklist: Patient identified, Emergency Drugs available, Suction available and Patient being monitored Patient Re-evaluated:Patient Re-evaluated prior to induction Oxygen Delivery Method: Circle System Utilized Preoxygenation: Pre-oxygenation with 100% oxygen Induction Type: IV induction Ventilation: Mask ventilation without difficulty Laryngoscope Size: Mac and 4 Grade View: Grade I Tube type: Oral Tube size: 8.0 mm Number of attempts: 1 Airway Equipment and Method: Stylet Placement Confirmation: ETT inserted through vocal cords under direct vision,  positive ETCO2 and breath sounds checked- equal and bilateral Secured at: 24 cm Tube secured with: Tape Dental Injury: Teeth and Oropharynx as per pre-operative assessment

## 2020-09-24 NOTE — Anesthesia Preprocedure Evaluation (Signed)
Anesthesia Evaluation  Patient identified by MRN, date of birth, ID band Patient awake    Reviewed: Allergy & Precautions, H&P , NPO status , Patient's Chart, lab work & pertinent test results  Airway Mallampati: II   Neck ROM: full    Dental   Pulmonary sleep apnea ,    breath sounds clear to auscultation       Cardiovascular hypertension,  Rhythm:regular Rate:Normal     Neuro/Psych    GI/Hepatic   Endo/Other    Renal/GU      Musculoskeletal  (+) Arthritis ,   Abdominal   Peds  Hematology   Anesthesia Other Findings   Reproductive/Obstetrics                             Anesthesia Physical Anesthesia Plan  ASA: II  Anesthesia Plan: General   Post-op Pain Management:    Induction: Intravenous  PONV Risk Score and Plan: 2 and Ondansetron, Dexamethasone, Midazolam and Treatment may vary due to age or medical condition  Airway Management Planned: Oral ETT  Additional Equipment:   Intra-op Plan:   Post-operative Plan: Extubation in OR  Informed Consent: I have reviewed the patients History and Physical, chart, labs and discussed the procedure including the risks, benefits and alternatives for the proposed anesthesia with the patient or authorized representative who has indicated his/her understanding and acceptance.       Plan Discussed with: CRNA, Anesthesiologist and Surgeon  Anesthesia Plan Comments:         Anesthesia Quick Evaluation

## 2020-09-24 NOTE — Progress Notes (Signed)
Orthopedic Tech Progress Note Patient Details:  Dale Grimes 02-21-1966 [id number]  Ortho Devices Type of Ortho Device: Lumbar corsett Ortho Device/Splint Interventions: Ordered,Application,Adjustment   Post Interventions Patient Tolerated: Well Instructions Provided: Care of device,Adjustment of device   Zebedee Iba 09/24/2020, 5:10 PM

## 2020-09-24 NOTE — Op Note (Signed)
PATIENT: Dale Grimes  DAY OF SURGERY: 09/24/20   PRE-OPERATIVE DIAGNOSIS:  Lumbar radiculopathy   POST-OPERATIVE DIAGNOSIS:  Same   PROCEDURE:  Bilateral L3-4, L4-5 and L5-S1 foraminotomies with complete facetectomies, L3-4/L4-5/L5-S1 TLIF with pedicle screw instrumentation and posterolateral fusion   SURGEON:  Surgeon(s) and Role:    Jadene Pierini, MD - Primary    Dawley, Kendell Bane, MD - Assisting   ANESTHESIA: ETGA   BRIEF HISTORY: This is a 55 year old man who presented with BLE radiculopathy with foot weakness. The patient was found to have severe foraminal disease at L4-5 and L5-S1. This was discussed with the patient as well as risks, benefits, and alternatives and wished to proceed with surgery.   OPERATIVE DETAIL: The patient was taken to the operating room and anesthesia was induced by the anesthesia team. They were placed on the OR table in the prone position with padding of all pressure points. A formal time out was performed with two patient identifiers and confirmed the operative site. The operative site was marked, hair was clipped with surgical clippers, the area was then prepped and draped in a sterile fashion. Fluoro was used to localize the operative level and a midline incision was placed to expose from L4 to S1. Subperiosteal dissection was performed bilaterally and fluoroscopy was again used to confirm the surgical level.   Decompression was started with bilateral facetectomies at the cranial aspect of the exposure. I used fluoroscopic guidance to confirm the location of the pedicles for the facetectomy. After getting the first fluoroscopic shot, I repeated a level count from the sacrum and realized I had performed a facetectomy at L3-4. Given that I had completely disrupted the joint on one side, I unfortunately had to include that level in the construct and completed the facetectomies bilaterally at L3-4. I then used fluoroscopy to confirm landmarks and level count and  performed bilateral facetectomies and foraminotomies at L4-5 and L5-S1. At each level, this was performed by using a using a combination of high speed drill and rongeurs, followed by removal of the ligamentum flavum, palpation and identification of the pedicles above and below, then following the exiting nerve root to confirm good decompression.  Instrumentation was then performed, starting with the interbody placement. At each level, the disc space was identified, nerve root and thecal sac were protected, an annulotomy was performed, then a maximal discectomy was performed with thorough endplate preparation. Using fluoroscopy for guidance, the disc space was packed with bone meal, expandable PEEK interbody devices (Medtronic) were placed into the disc space and expanded. At each level, AP and lateral fluoroscopy was used to confirm placement before release. Xrays showed good restoration of height and lordosis. This was performed on the left at L3-4 and L4-5 then on the right at L5-S1.  Fluoroscopy was used to guide placement of bilateral pedicle screws (Medtronic) at L3, L4, L5, and S1. These were placed by localizing the pedicle with standard landmarks, drilling a pilot hole, cannulating the pedicle with an awl, palpating for pedicle wall breaches, tapping, palpating, and then placing the screw. These were connected with rods bilaterally, compressed, and final tightened according to manufacturer torque specifications. The bone was thoroughly decorticated over the fusion surfaces along the transverse processes and the previously resected bone fragments were morselized and used as autograft. There was enough autograft that it did not need to be augmented with allograft.  A drain was placed subfascially, tunneled, and secured. All instrument and sponge counts were correct, the  incision was then closed in layers. The patient was then returned to anesthesia for emergence. No apparent complications at the  completion of the procedure. I discussed the above with the patient and his wife regarding the reasons behind the additional level of instrumentation.   EBL:    DRAINS: Subfascial drain   SPECIMENS: none   Jadene Pierini, MD 09/24/20 7:13 AM

## 2020-09-24 NOTE — Transfer of Care (Signed)
Immediate Anesthesia Transfer of Care Note  Patient: Dale Grimes  Procedure(s) Performed: Lumbar three -four, lumbar  four-five Lumbar five Sacral one Transforaminal lumbar interbody fusion (N/A Back)  Patient Location: PACU  Anesthesia Type:General  Level of Consciousness: drowsy and patient cooperative  Airway & Oxygen Therapy: Patient Spontanous Breathing and Patient connected to nasal cannula oxygen  Post-op Assessment: Report given to RN, Post -op Vital signs reviewed and stable and Patient moving all extremities  Post vital signs: Reviewed and stable  Last Vitals:  Vitals Value Taken Time  BP 132/72 09/24/20 1448  Temp    Pulse 115 09/24/20 1449  Resp 13 09/24/20 1449  SpO2 100 % 09/24/20 1449  Vitals shown include unvalidated device data.  Last Pain:  Vitals:   09/24/20 0627  TempSrc:   PainSc: 2          Complications: No complications documented.

## 2020-09-25 ENCOUNTER — Encounter (HOSPITAL_COMMUNITY): Payer: Self-pay | Admitting: Neurological Surgery

## 2020-09-25 NOTE — Progress Notes (Signed)
Neurosurgery Service Progress Note  Subjective: No acute events overnight. Significant back pain, as expected but not out of proportion, legs still feel much improved from preop.   Objective: Vitals:   09/24/20 1925 09/24/20 2313 09/25/20 0437 09/25/20 0749  BP: (!) 146/86 139/85 (!) 158/82 118/69  Pulse: 94 88 90 98  Resp: 20 20 20 19   Temp: 97.7 F (36.5 C) 97.9 F (36.6 C) (!) 97.5 F (36.4 C) 97.9 F (36.6 C)  TempSrc: Oral Oral Oral   SpO2: 98% 98% 95% 93%  Weight:      Height:       Temp (24hrs), Avg:97.6 F (36.4 C), Min:97.1 F (36.2 C), Max:97.9 F (36.6 C)  CBC Latest Ref Rng & Units 09/20/2020  WBC 4.0 - 10.5 K/uL 5.5  Hemoglobin 13.0 - 17.0 g/dL 11/18/2020  Hematocrit 40.9 - 52.0 % 41.5  Platelets 150 - 400 K/uL 240   BMP Latest Ref Rng & Units 09/20/2020  Glucose 70 - 99 mg/dL 11/18/2020)  BUN 6 - 20 mg/dL 14  Creatinine 914(N - 8.29 mg/dL 5.62)  Sodium 1.30(Q - 657 mmol/L 139  Potassium 3.5 - 5.1 mmol/L 3.9  Chloride 98 - 111 mmol/L 105  CO2 22 - 32 mmol/L 25  Calcium 8.9 - 10.3 mg/dL 9.3    Intake/Output Summary (Last 24 hours) at 09/25/2020 0825 Last data filed at 09/25/2020 0507 Gross per 24 hour  Intake 2600 ml  Output 1405 ml  Net 1195 ml    Current Facility-Administered Medications:  .  0.9 %  sodium chloride infusion, 250 mL, Intravenous, Continuous, Benz Vandenberghe A, MD .  acetaminophen (TYLENOL) tablet 650 mg, 650 mg, Oral, Q4H PRN **OR** acetaminophen (TYLENOL) suppository 650 mg, 650 mg, Rectal, Q4H PRN, Detrick Dani A, MD .  amLODipine (NORVASC) tablet 5 mg, 5 mg, Oral, Daily, Tahara Ruffini, 11/23/2020, MD, 5 mg at 09/24/20 1737 .  cyclobenzaprine (FLEXERIL) tablet 10 mg, 10 mg, Oral, TID PRN, 11/22/20, MD, 10 mg at 09/25/20 0427 .  docusate sodium (COLACE) capsule 100 mg, 100 mg, Oral, BID, 11/23/20, MD, 100 mg at 09/24/20 2154 .  irbesartan (AVAPRO) tablet 150 mg, 150 mg, Oral, Daily, 150 mg at 09/24/20 1736 **AND**  hydrochlorothiazide (MICROZIDE) capsule 12.5 mg, 12.5 mg, Oral, Daily, Tiye Huwe A, MD, 12.5 mg at 09/24/20 1737 .  HYDROmorphone (DILAUDID) injection 1 mg, 1 mg, Intravenous, Q3H PRN, 11/22/20, MD, 1 mg at 09/24/20 2154 .  latanoprost (XALATAN) 0.005 % ophthalmic solution 1 drop, 1 drop, Both Eyes, QHS, Abby Tucholski A, MD, 1 drop at 09/24/20 2154 .  menthol-cetylpyridinium (CEPACOL) lozenge 3 mg, 1 lozenge, Oral, PRN **OR** phenol (CHLORASEPTIC) mouth spray 1 spray, 1 spray, Mouth/Throat, PRN, Gianpaolo Mindel A, MD .  ondansetron (ZOFRAN) tablet 4 mg, 4 mg, Oral, Q6H PRN **OR** ondansetron (ZOFRAN) injection 4 mg, 4 mg, Intravenous, Q6H PRN, Tanishka Drolet A, MD .  oxyCODONE (Oxy IR/ROXICODONE) immediate release tablet 10 mg, 10 mg, Oral, Q4H PRN, 2155, MD, 10 mg at 09/25/20 0425 .  oxyCODONE (Oxy IR/ROXICODONE) immediate release tablet 5 mg, 5 mg, Oral, Q4H PRN, Sallyann Kinnaird A, MD .  oxymetazoline (AFRIN) 0.05 % nasal spray 1 spray, 1 spray, Each Nare, QHS PRN, 11/23/20, MD .  pantoprazole (PROTONIX) EC tablet 40 mg, 40 mg, Oral, Daily, Val Schiavo A, MD .  polyethylene glycol (MIRALAX / GLYCOLAX) packet 17 g, 17 g, Oral, Daily PRN, Jadene Pierini, MD .  simvastatin (ZOCOR) tablet 40 mg, 40 mg, Oral, QHS, Jadene Pierini, MD, 40 mg at 09/24/20 2154 .  sodium chloride flush (NS) 0.9 % injection 3 mL, 3 mL, Intravenous, Q12H, Tavi Gaughran, Clovis Pu, MD, 3 mL at 09/24/20 2201 .  sodium chloride flush (NS) 0.9 % injection 3 mL, 3 mL, Intravenous, PRN, Jadene Pierini, MD .  tamsulosin (FLOMAX) capsule 0.4 mg, 0.4 mg, Oral, QHS, Yasemin Rabon A, MD, 0.4 mg at 09/24/20 2154 .  traZODone (DESYREL) tablet 100 mg, 100 mg, Oral, QHS PRN, Jadene Pierini, MD   Physical Exam: AOx3, PERRL, EOMI, FS, Strength 5/5 x4 except R 4+/5 DF/EHL, SILTx4 except improving numbness in R foot  Assessment & Plan: 55 y.o. man s/p 3 level  open TLIF, recovering well.  -drain output 295 since OR, too high for removal, will need to keep until tomorrow and discharge tomorrow if drain output is improved -SCDs/TEDs, SQH tomorrow if pt will be staying until POD3  Abdoulaye Drum A Arieana Somoza  09/25/20 8:25 AM

## 2020-09-25 NOTE — Evaluation (Signed)
Occupational Therapy Evaluation Patient Details Name: Dale Grimes MRN: [id number] DOB: [birth year] Today's Date: 09/25/2020    History of Present Illness 55 yo male s/p L 3-4 L4-5 L5-s1 TLIF PMH arthritis, high cholesterol, HTn sleep apnea does not wear CPAP   Clinical Impression   Patient is s/p TLIF L3-S1 surgery resulting in functional limitations due to the deficits listed below (see OT problem list). Pt currently with balance deficits and needs reinforcement of back precautions. Pt has all AE at home.  Patient will benefit from skilled OT acutely to increase independence and safety with ADLS to allow discharge HHOT.     Follow Up Recommendations  Home health OT    Equipment Recommendations  None recommended by OT    Recommendations for Other Services PT consult     Precautions / Restrictions Precautions Precautions: Back Precaution Comments: back precautions reviewed and back hanout provided Required Braces or Orthoses: Spinal Brace Spinal Brace: Lumbar corset;Applied in sitting position      Mobility Bed Mobility Overal bed mobility: Needs Assistance Bed Mobility: Supine to Sit;Sit to Supine;Rolling Rolling: Min assist   Supine to sit: Min assist Sit to supine: Min assist   General bed mobility comments: min cues for safety back precautiosn    Transfers Overall transfer level: Needs assistance Equipment used: Rolling walker (2 wheeled) Transfers: Sit to/from Stand Sit to Stand: Min assist              Balance Overall balance assessment: Needs assistance         Standing balance support: Bilateral upper extremity supported;During functional activity Standing balance-Leahy Scale: Fair                             ADL either performed or assessed with clinical judgement   ADL Overall ADL's : Needs assistance/impaired Eating/Feeding: Independent   Grooming: Oral care;Min guard;Standing   Upper Body Bathing: Min guard;Sitting    Lower Body Bathing: Maximal assistance;Sit to/from stand           Toilet Transfer: Minimal Secondary school teacher Details (indicate cue type and reason): encouraged RW after attempt to progress to toilet         Functional mobility during ADLs: Minimal assistance;Rolling walker General ADL Comments: pt educated on back preucations. pt has all AE from previous surgery     Vision         Perception     Praxis      Pertinent Vitals/Pain Pain Assessment: 0-10 Pain Score: 4  Pain Location: back Pain Descriptors / Indicators: Discomfort Pain Intervention(s): Repositioned;Premedicated before session     Hand Dominance Right   Extremity/Trunk Assessment Upper Extremity Assessment Upper Extremity Assessment: Overall WFL for tasks assessed   Lower Extremity Assessment Lower Extremity Assessment: Defer to PT evaluation   Cervical / Trunk Assessment Cervical / Trunk Assessment: Other exceptions Cervical / Trunk Exceptions: s/p surg   Communication Communication Communication: No difficulties   Cognition Arousal/Alertness: Awake/alert Behavior During Therapy: WFL for tasks assessed/performed Overall Cognitive Status: Within Functional Limits for tasks assessed                                     General Comments  incision dry and open to air. Drain still intact    Exercises     Shoulder Instructions      Home Living Family/patient  expects to be discharged to:: Private residence Living Arrangements: Spouse/significant other Available Help at Discharge: Family;Available 24 hours/day (wife off 1 week) Type of Home: House Home Access: Stairs to enter [employer] of Steps: 2 Entrance Stairs-Rails: Left Home Layout: One level     Bathroom Shower/Tub: Producer, television/film/video: Handicapped height     Home Equipment: Environmental consultant - 2 wheels;Adaptive equipment;Crutches          Prior Functioning/Environment  Level of Independence: Independent                 OT Problem List: Decreased strength;Decreased activity tolerance;Impaired balance (sitting and/or standing);Decreased safety awareness;Decreased knowledge of use of DME or AE;Decreased knowledge of precautions;Pain      OT Treatment/Interventions: Self-care/ADL training;Therapeutic exercise;Neuromuscular education;Energy conservation;DME and/or AE instruction;Manual therapy;Therapeutic activities;Patient/family education;Balance training    OT Goals(Current goals can be found in the care plan section) Acute Rehab OT Goals Patient Stated Goal: to lay down OT Goal Formulation: With patient Time For Goal Achievement: 10/09/20 Potential to Achieve Goals: Good  OT Frequency: Min 2X/week   Barriers to D/C:            Co-evaluation              AM-PAC OT "6 Clicks" Daily Activity     Outcome Measure Help from another person eating meals?: None Help from another person taking care of personal grooming?: A Little Help from another person toileting, which includes using toliet, bedpan, or urinal?: A Little Help from another person bathing (including washing, rinsing, drying)?: A Lot Help from another person to put on and taking off regular upper body clothing?: A Little Help from another person to put on and taking off regular lower body clothing?: A Lot 6 Click Score: 17   End of Session Equipment Utilized During Treatment: Rolling walker Nurse Communication: Mobility status;Precautions  Activity Tolerance: Patient tolerated treatment well Patient left: in bed;with call bell/phone within reach  OT Visit Diagnosis: Unsteadiness on feet (R26.81)                Time: 1610-9604 OT Time Calculation (min): 27 min Charges:  OT General Charges $OT Visit: 1 Visit OT Evaluation $OT Eval Moderate Complexity: 1 Mod OT Treatments $Self Care/Home Management : 8-22 mins   Brynn, OTR/L  Acute Rehabilitation Services Pager:  [phone] Office: [phone] .   Mateo Flow 09/25/2020, 8:51 AM

## 2020-09-25 NOTE — Progress Notes (Signed)
OT NOTE  Recommendation for HHOT at this time. Pt has all necessary DME and AE at this time.   Timmothy Euler, OTR/L  Acute Rehabilitation Services Pager: [phone] Office: [phone] .

## 2020-09-25 NOTE — Evaluation (Addendum)
Physical Therapy Evaluation Patient Details Name: Dale Grimes MRN: [id number] DOB: 05/14/1966 Today's Date: 09/25/2020   History of Present Illness  55 yo male s/p L 3-4 L4-5 L5-s1 TLIF PMH arthritis, high cholesterol, HTn sleep apnea does not wear CPAP  Clinical Impression  Pt admitted with above diagnosis. At the time of PT eval, pt was able to demonstrate transfers and ambulation with gross min guard assist to min assist and RW for support. Pt moving very slow and guarded due to pain. Pt was educated on precautions, brace application/wearing schedule, appropriate activity progression, and car transfer. Pt currently with functional limitations due to the deficits listed below (see PT Problem List). Pt will benefit from skilled PT to increase their independence and safety with mobility to allow discharge to the venue listed below.      Follow Up Recommendations Home health PT;Supervision for mobility/OOB    Equipment Recommendations  Rolling walker with 5" wheels    Recommendations for Other Services       Precautions / Restrictions Precautions Precautions: Back Precaution Comments: back precautions reviewed and back handout provided Required Braces or Orthoses: Spinal Brace Spinal Brace: Lumbar corset;Applied in sitting position Restrictions Weight Bearing Restrictions: No      Mobility  Bed Mobility Overal bed mobility: Needs Assistance Bed Mobility: Rolling;Sidelying to Sit Rolling: Min guard Sidelying to sit: Min guard       General bed mobility comments: VC's for proper log roll technique. Close guard but pt able to complete with increased time/effort and no assist.    Transfers Overall transfer level: Needs assistance Equipment used: Rolling walker (2 wheeled) Transfers: Sit to/from Stand Sit to Stand: Min assist         General transfer comment: VC's for hand placement on seated surface for safety. Bed height significantly elevated for ease of transfer due  to pt height and pain.  Ambulation/Gait Ambulation/Gait assistance: Min assist Gait Distance (Feet): 250 Feet Assistive device: Rolling walker (2 wheeled) Gait Pattern/deviations: Step-through pattern;Decreased stride length;Trunk flexed Gait velocity: Decreased Gait velocity interpretation: <1.31 ft/sec, indicative of household ambulator General Gait Details: VC's for improved posture throughout gait training. Several short standing rest breaks due to pain or SOB. Pt on RA throughout gait training with O2 sats at 96%  Stairs            Wheelchair Mobility    Modified Rankin (Stroke Patients Only)       Balance Overall balance assessment: Needs assistance         Standing balance support: Bilateral upper extremity supported;During functional activity Standing balance-Leahy Scale: Fair                               Pertinent Vitals/Pain Pain Assessment: 0-10 Pain Score: 5  Pain Location: back Pain Descriptors / Indicators: Discomfort Pain Intervention(s): Limited activity within patient's tolerance;Monitored during session;Repositioned    Home Living Family/patient expects to be discharged to:: Private residence Living Arrangements: Spouse/significant other Available Help at Discharge: Family;Available 24 hours/day (wife off 1 week) Type of Home: House Home Access: Stairs to enter Entrance Stairs-Rails: Left Entrance Stairs-Number of Steps: 2 Home Layout: One level Home Equipment: Walker - 2 wheels;Adaptive equipment;Crutches      Prior Function Level of Independence: Independent               Hand Dominance   Dominant Hand: Right    Extremity/Trunk Assessment   Upper Extremity Assessment  Upper Extremity Assessment: Defer to OT evaluation    Lower Extremity Assessment Lower Extremity Assessment: Generalized weakness (Consistent with pre-op diagnosis)    Cervical / Trunk Assessment Cervical / Trunk Assessment: Other  exceptions Cervical / Trunk Exceptions: s/p surg  Communication   Communication: No difficulties  Cognition Arousal/Alertness: Awake/alert Behavior During Therapy: WFL for tasks assessed/performed Overall Cognitive Status: Within Functional Limits for tasks assessed                                        General Comments      Exercises     Assessment/Plan    PT Assessment Patient needs continued PT services  PT Problem List Decreased strength;Decreased activity tolerance;Decreased balance;Decreased mobility;Decreased knowledge of use of DME;Decreased safety awareness;Decreased knowledge of precautions;Pain       PT Treatment Interventions DME instruction;Gait training;Functional mobility training;Therapeutic activities;Therapeutic exercise;Neuromuscular re-education;Patient/family education;Stair training    PT Goals (Current goals can be found in the Care Plan section)  Acute Rehab PT Goals Patient Stated Goal: Decrease pain PT Goal Formulation: With patient Time For Goal Achievement: 10/02/20 Potential to Achieve Goals: Good    Frequency Min 5X/week   Barriers to discharge        Co-evaluation               AM-PAC PT "6 Clicks" Mobility  Outcome Measure Help needed turning from your back to your side while in a flat bed without using bedrails?: A Little Help needed moving from lying on your back to sitting on the side of a flat bed without using bedrails?: A Little Help needed moving to and from a bed to a chair (including a wheelchair)?: A Little Help needed standing up from a chair using your arms (e.g., wheelchair or bedside chair)?: A Little Help needed to walk in hospital room?: A Little Help needed climbing 3-5 steps with a railing? : A Little 6 Click Score: 18    End of Session Equipment Utilized During Treatment: Gait belt;Back brace Activity Tolerance: Patient tolerated treatment well Patient left: in chair;with call bell/phone  within reach Nurse Communication: Mobility status PT Visit Diagnosis: Unsteadiness on feet (R26.81);Pain Pain - part of body:  (back)    Time: 6378-5885 PT Time Calculation (min) (ACUTE ONLY): 28 min   Charges:   PT Evaluation $PT Eval Moderate Complexity: 1 Mod PT Treatments $Gait Training: 8-22 mins        Conni Slipper, PT, DPT Acute Rehabilitation Services Pager: [phone] Office: [phone]   Marylynn Pearson 09/25/2020, 12:54 PM

## 2020-09-26 MED ORDER — CYCLOBENZAPRINE HCL 10 MG PO TABS: 10.0000 mg | ORAL_TABLET | Freq: Three times a day (TID) | ORAL | 0 refills | Status: AC | PRN

## 2020-09-26 MED ORDER — OXYCODONE HCL 5 MG PO TABS: 5.0000 mg | ORAL_TABLET | ORAL | 0 refills | Status: AC | PRN

## 2020-09-26 MED ORDER — MELOXICAM 15 MG PO TABS: 15.0000 mg | ORAL_TABLET | Freq: Every day | ORAL | Status: AC

## 2020-09-26 NOTE — TOC Transition Note (Addendum)
Transition of Care [facility]) - CM/SW Discharge Note   Patient Details  Name: Dale Grimes MRN: [id number] Date of Birth: August 01, 1966  Transition of Care [facility]) CM/SW Contact:  Beckie Busing, RN Phone Number: [phone]  09/26/2020, 12:28 PM   Clinical Narrative:    [facility] consulted for home health.  Home Health referral  called to [facility] [employer]. Info faxed per agency request attention to [facility]. Info has been updated on AVS and bedside nurse has been made aware. TOC will sign off.    1250 [facility] called to say that they are now unable to accept patient due to staffing. CM will call other agencies to attemt to set up [facility].  1320 Info has been faxed to [facility]  And [facility] for review. All other agencies on list have said no they do not accept [employer] investment banker home Health, [employer], [facility], [facility], & Team Nurse)  1330 CM received call from [facility] stating that they are unable to provide services due to patient not meeting deductible and staffing shortage. Will await return call from [facility].      Final next level of care: Home w Home Health Services Barriers to Discharge: No Barriers Identified   Patient Goals and CMS Choice   CMS Medicare.gov Compare Post Acute Care list provided to:: Patient Choice offered to / list presented to : Patient  Discharge Placement                       Discharge Plan and Services                DME Arranged: N/A DME Agency: NA       HH Arranged: PT,OT HH Agency: [facility] Date HH Agency Contacted: 09/26/20 Time HH Agency Contacted: 1228 Representative spoke with at [facility] Agency: Whitney Post  Social Determinants of Health (SDOH) Interventions     Readmission Risk Interventions No flowsheet data found.

## 2020-09-26 NOTE — Progress Notes (Signed)
Neurosurgery Service Progress Note  Subjective: No acute events overnight. Back pain improving, legs feel great  Objective: Vitals:   09/25/20 1953 09/25/20 2315 09/26/20 0405 09/26/20 0742  BP: 114/63 112/66 100/72 125/72  Pulse: 97 (!) 107 98 (!) 107  Resp: 20 20 20 18   Temp: 98.3 F (36.8 C) 100.1 F (37.8 C) 99.7 F (37.6 C) 98.1 F (36.7 C)  TempSrc: Oral Oral Oral Oral  SpO2: 99% 97% 96% 96%  Weight:      Height:       Temp (24hrs), Avg:98.8 F (37.1 C), Min:97.5 F (36.4 C), Max:100.1 F (37.8 C)  CBC Latest Ref Rng & Units 09/20/2020  WBC 4.0 - 10.5 K/uL 5.5  Hemoglobin 13.0 - 17.0 g/dL 11/18/2020  Hematocrit 47.4 - 52.0 % 41.5  Platelets 150 - 400 K/uL 240   BMP Latest Ref Rng & Units 09/20/2020  Glucose 70 - 99 mg/dL 11/18/2020)  BUN 6 - 20 mg/dL 14  Creatinine 563(O - 7.56 mg/dL 4.33)  Sodium 2.95(J - 884 mmol/L 139  Potassium 3.5 - 5.1 mmol/L 3.9  Chloride 98 - 111 mmol/L 105  CO2 22 - 32 mmol/L 25  Calcium 8.9 - 10.3 mg/dL 9.3    Intake/Output Summary (Last 24 hours) at 09/26/2020 0834 Last data filed at 09/26/2020 0744 Gross per 24 hour  Intake 480 ml  Output 190 ml  Net 290 ml    Current Facility-Administered Medications:    0.9 %  sodium chloride infusion, 250 mL, Intravenous, Continuous, Hank Walling, 09/28/2020, MD   acetaminophen (TYLENOL) tablet 650 mg, 650 mg, Oral, Q4H PRN, 650 mg at 09/25/20 1253 **OR** acetaminophen (TYLENOL) suppository 650 mg, 650 mg, Rectal, Q4H PRN, 11/23/20, MD   amLODipine (NORVASC) tablet 5 mg, 5 mg, Oral, Daily, Rini Moffit A, MD, 5 mg at 09/25/20 1031   cyclobenzaprine (FLEXERIL) tablet 10 mg, 10 mg, Oral, TID PRN, 11/23/20, MD, 10 mg at 09/26/20 09/28/20   docusate sodium (COLACE) capsule 100 mg, 100 mg, Oral, BID, 0630, MD, 100 mg at 09/25/20 2100   irbesartan (AVAPRO) tablet 150 mg, 150 mg, Oral, Daily, 150 mg at 09/25/20 1031 **AND** hydrochlorothiazide (MICROZIDE) capsule 12.5 mg, 12.5 mg,  Oral, Daily, Malayiah Mcbrayer A, MD, 12.5 mg at 09/25/20 1031   HYDROmorphone (DILAUDID) injection 1 mg, 1 mg, Intravenous, Q3H PRN, 11/23/20, MD, 1 mg at 09/25/20 1423   latanoprost (XALATAN) 0.005 % ophthalmic solution 1 drop, 1 drop, Both Eyes, QHS, Adham Johnson A, MD, 1 drop at 09/25/20 2101   menthol-cetylpyridinium (CEPACOL) lozenge 3 mg, 1 lozenge, Oral, PRN **OR** phenol (CHLORASEPTIC) mouth spray 1 spray, 1 spray, Mouth/Throat, PRN, Takima Encina, 2102, MD   ondansetron (ZOFRAN) tablet 4 mg, 4 mg, Oral, Q6H PRN **OR** ondansetron (ZOFRAN) injection 4 mg, 4 mg, Intravenous, Q6H PRN, Clovis Pu, MD   oxyCODONE (Oxy IR/ROXICODONE) immediate release tablet 10 mg, 10 mg, Oral, Q4H PRN, Jadene Pierini, MD, 10 mg at 09/26/20 09/28/20   oxyCODONE (Oxy IR/ROXICODONE) immediate release tablet 5 mg, 5 mg, Oral, Q4H PRN, 1601, MD   oxymetazoline (AFRIN) 0.05 % nasal spray 1 spray, 1 spray, Each Nare, QHS PRN, Jadene Pierini, MD   pantoprazole (PROTONIX) EC tablet 40 mg, 40 mg, Oral, Daily, Railynn Ballo A, MD, 40 mg at 09/25/20 1031   polyethylene glycol (MIRALAX / GLYCOLAX) packet 17 g, 17 g, Oral, Daily PRN, 11/23/20, MD   simvastatin (ZOCOR) tablet  40 mg, 40 mg, Oral, QHS, Ellysia Char, Clovis Pu, MD, 40 mg at 09/25/20 2101   sodium chloride flush (NS) 0.9 % injection 3 mL, 3 mL, Intravenous, Q12H, Luverna Degenhart A, MD, 3 mL at 09/24/20 2201   sodium chloride flush (NS) 0.9 % injection 3 mL, 3 mL, Intravenous, PRN, Jadene Pierini, MD   tamsulosin (FLOMAX) capsule 0.4 mg, 0.4 mg, Oral, QHS, Josefina Rynders, Clovis Pu, MD, 0.4 mg at 09/25/20 2100   traZODone (DESYREL) tablet 100 mg, 100 mg, Oral, QHS PRN, Jadene Pierini, MD   Physical Exam: AOx3, PERRL, EOMI, FS, Strength 5/5 x4 except R 4+/5 DF/EHL, SILTx4 except improving numbness in R foot  Assessment & Plan: 55 y.o. man s/p 3 level open TLIF, recovering well.  -drain output down  to 40 in the last shift, can d/c this morning and discharge home -SCDs/TEDs, SQH tomorrow if pt will be staying until POD3  Maisie Fus A Iwalani Templeton  09/26/20 8:34 AM

## 2020-09-26 NOTE — Progress Notes (Signed)
Physical Therapy Treatment Patient Details Name: Dale Grimes MRN: [id number] DOB: October 27, 1965 Today's Date: 09/26/2020    History of Present Illness 55 yo male s/p L 3-4 L4-5 L5-s1 TLIF PMH arthritis, high cholesterol, HTn sleep apnea does not wear CPAP    PT Comments    Pt progressing well with post-op mobility. He was able to demonstrate transfers and ambulation with gross supervision for safety and RW for support. Pt was educated on precautions, brace application/wearing schedule, appropriate activity progression, and car transfer. Will continue to follow.     Follow Up Recommendations  Home health PT;Supervision for mobility/OOB     Equipment Recommendations  Rolling walker with 5" wheels    Recommendations for Other Services       Precautions / Restrictions Precautions Precautions: Back Precaution Comments: back precautions reviewed and back hanout provided Required Braces or Orthoses: Spinal Brace Spinal Brace: Lumbar corset;Applied in sitting position Restrictions Weight Bearing Restrictions: No    Mobility  Bed Mobility Overal bed mobility: Needs Assistance Bed Mobility: Rolling;Sidelying to Sit;Sit to Sidelying Rolling: Modified independent (Device/Increase time) Sidelying to sit: Supervision     Sit to sidelying: Supervision General bed mobility comments: VC's for proper log roll technique.  Transfers Overall transfer level: Needs assistance Equipment used: Rolling walker (2 wheeled) Transfers: Sit to/from Stand Sit to Stand: Supervision         General transfer comment: VC's for hand placement on seated surface for safety. Bed height significantly elevated for ease of transfer due to pt height and pain.  Ambulation/Gait Ambulation/Gait assistance: Supervision Gait Distance (Feet): 250 Feet Assistive device: Rolling walker (2 wheeled) Gait Pattern/deviations: Step-through pattern;Decreased stride length;Trunk flexed Gait velocity: Decreased Gait  velocity interpretation: <1.31 ft/sec, indicative of household ambulator General Gait Details: VC's for improved posture throughout gait training. SOB noted however not as significant as during yesterday's session.   Stairs Stairs: Yes Stairs assistance: Min guard Stair Management: One rail Left;Step to pattern;Forwards Number of Stairs: 3 General stair comments: VC's for sequencing and general safety. No assist required.   Wheelchair Mobility    Modified Rankin (Stroke Patients Only)       Balance Overall balance assessment: Needs assistance Sitting-balance support: No upper extremity supported;Feet supported Sitting balance-Leahy Scale: Fair     Standing balance support: Bilateral upper extremity supported;During functional activity Standing balance-Leahy Scale: Fair                              Cognition Arousal/Alertness: Awake/alert Behavior During Therapy: WFL for tasks assessed/performed Overall Cognitive Status: Within Functional Limits for tasks assessed                                        Exercises      General Comments        Pertinent Vitals/Pain Pain Assessment: 0-10 Pain Score: 5  Pain Location: back Pain Descriptors / Indicators: Discomfort Pain Intervention(s): Limited activity within patient's tolerance;Monitored during session;Repositioned    Home Living                      Prior Function            PT Goals (current goals can now be found in the care plan section) Acute Rehab PT Goals Patient Stated Goal: Decrease pain PT Goal Formulation: With patient Time For  Goal Achievement: 10/02/20 Potential to Achieve Goals: Good Progress towards PT goals: Progressing toward goals    Frequency    Min 5X/week      PT Plan Current plan remains appropriate    Co-evaluation              AM-PAC PT "6 Clicks" Mobility   Outcome Measure  Help needed turning from your back to your side while  in a flat bed without using bedrails?: A Little Help needed moving from lying on your back to sitting on the side of a flat bed without using bedrails?: A Little Help needed moving to and from a bed to a chair (including a wheelchair)?: A Little Help needed standing up from a chair using your arms (e.g., wheelchair or bedside chair)?: A Little Help needed to walk in hospital room?: A Little Help needed climbing 3-5 steps with a railing? : A Little 6 Click Score: 18    End of Session Equipment Utilized During Treatment: Gait belt;Back brace Activity Tolerance: Patient tolerated treatment well Patient left: in chair;with call bell/phone within reach Nurse Communication: Mobility status PT Visit Diagnosis: Unsteadiness on feet (R26.81);Pain Pain - part of body:  (back)     Time: 1834-3735 PT Time Calculation (min) (ACUTE ONLY): 22 min  Charges:  $Gait Training: 8-22 mins                     Conni Slipper, PT, DPT Acute Rehabilitation Services Pager: [phone] Office: [phone]    Marylynn Pearson 09/26/2020, 11:16 AM

## 2020-09-26 NOTE — Progress Notes (Signed)
Patient is discharged from room 3C09 at this time. Alert and in stable condition. IV site d/c'd and instructions read to patient and spouse with understanding verbalized. Left unit via wheelchair with all belongings at side. 

## 2020-09-26 NOTE — Discharge Summary (Signed)
Discharge Summary  Date of Admission: 09/24/2020  Date of Discharge: 09/26/20  Attending Physician: Autumn Patty, MD  Hospital Course: Patient was admitted following an uncomplicated 3 level open TLIF. He was recovered in PACU and transferred to [facility]. His drain output downtrended and was removed. His hospital course was uncomplicated and the patient was discharged home on 09/26/20. He will follow up in clinic with me in 2 weeks.  Neurologic exam at discharge:  AOx3, PERRL, EOMI, FS, TM Strength 5/5 x4 except 4+/5 in DF/EHL on R, SILTx4 except improving numbness in R foot  Discharge diagnosis: Lumbar radiculopathy  Jadene Pierini, MD 09/26/20 8:37 AM

## 2020-09-26 NOTE — Discharge Instructions (Addendum)
Discharge Instructions ° °No restriction in activities, slowly increase your activity back to normal.  ° °Your incision is closed with dermabond (purple glue). This will naturally fall off over the next 1-2 weeks.  ° °Okay to shower on the day of discharge. Use regular soap and water and try to be gentle when cleaning your incision.  ° °Follow up with Dr. Ostergard in 2 weeks after discharge. If you do not already have a discharge appointment, please call his office at [phone] to schedule a follow up appointment. If you have any concerns or questions, please call the office and let us know. °

## 2020-09-26 NOTE — Anesthesia Postprocedure Evaluation (Signed)
Anesthesia Post Note  Patient: Dale Grimes  Procedure(s) Performed: Lumbar three -four, lumbar  four-five Lumbar five Sacral one Transforaminal lumbar interbody fusion (N/A Back)     Patient location during evaluation: PACU Anesthesia Type: General Level of consciousness: awake and alert Pain management: pain level controlled Vital Signs Assessment: post-procedure vital signs reviewed and stable Respiratory status: spontaneous breathing, nonlabored ventilation, respiratory function stable and patient connected to nasal cannula oxygen Cardiovascular status: blood pressure returned to baseline and stable Postop Assessment: no apparent nausea or vomiting Anesthetic complications: no   No complications documented.  Last Vitals:  Vitals:   09/25/20 2315 09/26/20 0405  BP: 112/66 100/72  Pulse: (!) 107 98  Resp: 20 20  Temp: 37.8 C 37.6 C  SpO2: 97% 96%    Last Pain:  Vitals:   09/26/20 0418  TempSrc:   PainSc: Asleep                 Sharrod Achille S

## 2021-05-28 IMAGING — RF DG C-ARM 1-60 MIN
1 series · 1 of 1 positions shown · IV contrast (agent unspecified)
Comparison: None.

CLINICAL DATA: L4 through S1 TLIF

EXAM:
DG C-ARM 1-60 MIN
CONTRAST:  None
FLUOROSCOPY TIME:  Fluoroscopy Time:  [DATE]
Number of Acquired Spot Images: 1

[Series 1: run · 1 of 1 slices shown]
[im 1/1]
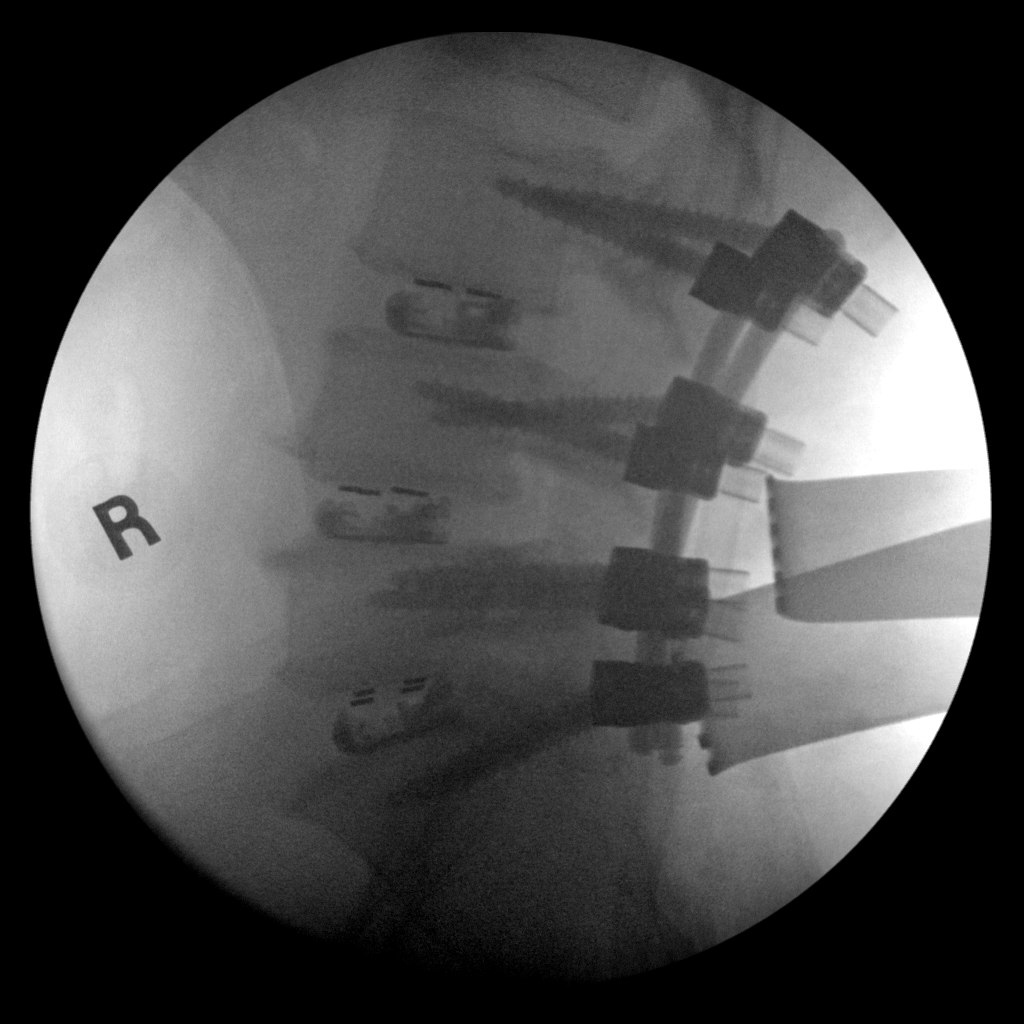

[1 of 1 positions shown; findings below may reference images not displayed]

FINDINGS: Fluoroscopic assistance is provided for L4 through S1 TLIF. Single
intraoperative lateral fluoroscopic image of the lumbar spine
demonstrates discectomy and posterior fusion of L3 through S1 with
overlying retractors.
IMPRESSION: Fluoroscopic assistance is provided for L4 through S1 TLIF. Single
intraoperative lateral fluoroscopic image of the lumbar spine
demonstrates discectomy and posterior fusion of L3 through S1 with
overlying retractors.
